# Patient Record
Sex: Female | Born: 1955 | Race: Black or African American | Hispanic: No | Smoking: Never smoker
Health system: Southern US, Community
[De-identification: ages and names within clinical notes are randomized; demographics above are authoritative.]

## PROBLEM LIST (undated history)

## (undated) ENCOUNTER — Ambulatory Visit: Admission: EM | Discharge status: Absent without leave | Payer: Self-pay

## (undated) DIAGNOSIS — G4700 Insomnia, unspecified: Secondary | ICD-10-CM

## (undated) DIAGNOSIS — R51 Headache: Secondary | ICD-10-CM

## (undated) DIAGNOSIS — K219 Gastro-esophageal reflux disease without esophagitis: Secondary | ICD-10-CM

## (undated) DIAGNOSIS — R519 Headache, unspecified: Secondary | ICD-10-CM

## (undated) DIAGNOSIS — M5030 Other cervical disc degeneration, unspecified cervical region: Secondary | ICD-10-CM

## (undated) HISTORY — PX: ABDOMINAL HYSTERECTOMY: SHX81

## (undated) HISTORY — PX: ECTOPIC PREGNANCY SURGERY: SHX613

---

## 1998-05-23 ENCOUNTER — Other Ambulatory Visit: Admission: RE | Admit: 1998-05-23 | Discharge: 1998-05-23 | Payer: Self-pay | Admitting: Obstetrics and Gynecology

## 1999-07-17 ENCOUNTER — Other Ambulatory Visit: Admission: RE | Admit: 1999-07-17 | Discharge: 1999-07-17 | Payer: Self-pay | Admitting: Obstetrics and Gynecology

## 2000-11-09 ENCOUNTER — Emergency Department (HOSPITAL_COMMUNITY): Admission: EM | Admit: 2000-11-09 | Discharge: 2000-11-09 | Payer: Self-pay | Admitting: Emergency Medicine

## 2000-11-09 ENCOUNTER — Encounter: Payer: Self-pay | Admitting: Emergency Medicine

## 2001-06-14 ENCOUNTER — Other Ambulatory Visit: Admission: RE | Admit: 2001-06-14 | Discharge: 2001-06-14 | Payer: Self-pay | Admitting: Obstetrics and Gynecology

## 2003-03-20 ENCOUNTER — Other Ambulatory Visit: Admission: RE | Admit: 2003-03-20 | Discharge: 2003-03-20 | Payer: Self-pay | Admitting: *Deleted

## 2003-08-30 ENCOUNTER — Ambulatory Visit (HOSPITAL_COMMUNITY): Admission: RE | Admit: 2003-08-30 | Discharge: 2003-08-30 | Payer: Self-pay | Admitting: *Deleted

## 2004-04-15 ENCOUNTER — Ambulatory Visit: Payer: Self-pay | Admitting: Internal Medicine

## 2004-05-26 ENCOUNTER — Ambulatory Visit (HOSPITAL_COMMUNITY): Admission: RE | Admit: 2004-05-26 | Discharge: 2004-05-26 | Payer: Self-pay | Admitting: Internal Medicine

## 2005-03-05 ENCOUNTER — Other Ambulatory Visit: Admission: RE | Admit: 2005-03-05 | Discharge: 2005-03-05 | Payer: Self-pay | Admitting: Obstetrics & Gynecology

## 2007-02-23 ENCOUNTER — Encounter: Admission: RE | Admit: 2007-02-23 | Discharge: 2007-02-23 | Payer: Self-pay | Admitting: Specialist

## 2010-05-10 ENCOUNTER — Encounter: Payer: Self-pay | Admitting: Internal Medicine

## 2011-11-12 ENCOUNTER — Ambulatory Visit (INDEPENDENT_AMBULATORY_CARE_PROVIDER_SITE_OTHER): Payer: BC Managed Care – PPO | Admitting: Emergency Medicine

## 2011-11-12 VITALS — BP 138/78 | HR 86 | Temp 97.6°F | Resp 16 | Ht 63.5 in | Wt 139.8 lb

## 2011-11-12 DIAGNOSIS — T6391XA Toxic effect of contact with unspecified venomous animal, accidental (unintentional), initial encounter: Secondary | ICD-10-CM

## 2011-11-12 DIAGNOSIS — T63481A Toxic effect of venom of other arthropod, accidental (unintentional), initial encounter: Secondary | ICD-10-CM

## 2011-11-12 MED ORDER — EPINEPHRINE 0.3 MG/0.3ML IJ DEVI: 0.3000 mg | Freq: Once | INTRAMUSCULAR | Status: DC

## 2011-11-12 MED ORDER — METHYLPREDNISOLONE ACETATE 80 MG/ML IJ SUSP
80.0000 mg | Freq: Once | INTRAMUSCULAR | Status: AC
  Administered 2011-11-12: 80 mg via INTRAMUSCULAR

## 2011-11-12 NOTE — Progress Notes (Signed)
  Subjective:    Patient ID: Kristen Chen, female    DOB: [birth year], 56 y.o.   MRN: [id number]  HPI patient presents with multiple stings on both arms and her trough from Tuesday night. She is taking Benadryl without much improvement she's also been using some cortisone cream to    Review of Systems     Objective:   Physical Exam there are multiple insets things involving the arms and back. Some of these extend up to 6 inches in size. Her chest is clear her throat is normal        Assessment & Plan:  She will continue with Benadryl ice and elevation. She is given 80 of Depo-Medrol today. She will continue to ice the areas.

## 2011-11-18 ENCOUNTER — Ambulatory Visit (INDEPENDENT_AMBULATORY_CARE_PROVIDER_SITE_OTHER): Payer: BC Managed Care – PPO | Admitting: Emergency Medicine

## 2011-11-18 VITALS — BP 141/82 | HR 66 | Temp 98.4°F | Resp 16 | Ht 63.5 in | Wt 138.0 lb

## 2011-11-18 DIAGNOSIS — T6391XA Toxic effect of contact with unspecified venomous animal, accidental (unintentional), initial encounter: Secondary | ICD-10-CM

## 2011-11-18 DIAGNOSIS — T63481A Toxic effect of venom of other arthropod, accidental (unintentional), initial encounter: Secondary | ICD-10-CM

## 2011-11-18 MED ORDER — METHYLPREDNISOLONE ACETATE 80 MG/ML IJ SUSP
80.0000 mg | Freq: Once | INTRAMUSCULAR | Status: AC
  Administered 2011-11-18: 80 mg via INTRAMUSCULAR

## 2011-11-18 NOTE — Progress Notes (Signed)
  Subjective:    Patient ID: Kristen Chen, female    DOB: April 23, 1955, 56 y.o.   MRN: [id number]  HPI  Repeat visit. Here last week with multiple hymenoptera stings.  No shortness of breath or wheezing.  Did well and now beginning to have more erythema as steroid wears off.   Denies other complaint  Review of Systems As per HPI, otherwise negative.      Objective:   Physical Exam  NAD  Has multiple erythematous pruritic lesions on arms. Chest CTA  BS=  GEN: WDWN, NAD, Non-toxic, Alert & Oriented x 3 HEENT: Atraumatic, Normocephalic.  Ears and Nose: No external deformity. EXTR: No clubbing/cyanosis/edema NEURO: Normal gait.  PSYCH: Normally interactive. Conversant. Not depressed or anxious appearing.  Calm demeanor.        Assessment & Plan:  Insect stings Depo medrol Follow up as needed Continue benadryl

## 2012-12-21 ENCOUNTER — Other Ambulatory Visit: Payer: Self-pay | Admitting: Physical Medicine and Rehabilitation

## 2012-12-21 DIAGNOSIS — M542 Cervicalgia: Secondary | ICD-10-CM

## 2012-12-31 ENCOUNTER — Ambulatory Visit
Admission: RE | Admit: 2012-12-31 | Discharge: 2012-12-31 | Disposition: A | Discharge status: Home / Self Care | Payer: BC Managed Care – PPO | Source: Ambulatory Visit | Attending: Physical Medicine and Rehabilitation | Admitting: Physical Medicine and Rehabilitation

## 2012-12-31 DIAGNOSIS — M542 Cervicalgia: Secondary | ICD-10-CM

## 2013-04-04 ENCOUNTER — Ambulatory Visit
Admission: RE | Admit: 2013-04-04 | Discharge: 2013-04-04 | Disposition: A | Discharge status: Home / Self Care | Payer: BC Managed Care – PPO | Source: Ambulatory Visit | Attending: Physical Medicine and Rehabilitation | Admitting: Physical Medicine and Rehabilitation

## 2013-04-04 ENCOUNTER — Other Ambulatory Visit: Payer: Self-pay | Admitting: Physical Medicine and Rehabilitation

## 2013-04-04 DIAGNOSIS — M25561 Pain in right knee: Secondary | ICD-10-CM

## 2013-04-05 ENCOUNTER — Other Ambulatory Visit: Payer: Self-pay | Admitting: Physical Medicine and Rehabilitation

## 2013-04-05 DIAGNOSIS — M25561 Pain in right knee: Secondary | ICD-10-CM

## 2013-04-15 ENCOUNTER — Other Ambulatory Visit: Payer: BC Managed Care – PPO

## 2014-01-23 ENCOUNTER — Emergency Department (HOSPITAL_COMMUNITY)
Admission: EM | Admit: 2014-01-23 | Discharge: 2014-01-23 | Disposition: A | Discharge status: Home / Self Care | Payer: BC Managed Care – PPO | Attending: Emergency Medicine | Admitting: Emergency Medicine

## 2014-01-23 ENCOUNTER — Encounter (HOSPITAL_COMMUNITY): Payer: Self-pay | Admitting: Emergency Medicine

## 2014-01-23 DIAGNOSIS — Z8719 Personal history of other diseases of the digestive system: Secondary | ICD-10-CM | POA: Insufficient documentation

## 2014-01-23 DIAGNOSIS — Z79899 Other long term (current) drug therapy: Secondary | ICD-10-CM | POA: Insufficient documentation

## 2014-01-23 DIAGNOSIS — G4700 Insomnia, unspecified: Secondary | ICD-10-CM | POA: Diagnosis not present

## 2014-01-23 DIAGNOSIS — R51 Headache: Secondary | ICD-10-CM | POA: Diagnosis present

## 2014-01-23 DIAGNOSIS — Z791 Long term (current) use of non-steroidal anti-inflammatories (NSAID): Secondary | ICD-10-CM | POA: Insufficient documentation

## 2014-01-23 DIAGNOSIS — G44209 Tension-type headache, unspecified, not intractable: Secondary | ICD-10-CM | POA: Insufficient documentation

## 2014-01-23 HISTORY — DX: Other cervical disc degeneration, unspecified cervical region: M50.30

## 2014-01-23 HISTORY — DX: Insomnia, unspecified: G47.00

## 2014-01-23 HISTORY — DX: Headache, unspecified: R51.9

## 2014-01-23 HISTORY — DX: Gastro-esophageal reflux disease without esophagitis: K21.9

## 2014-01-23 HISTORY — DX: Headache: R51

## 2014-01-23 MED ORDER — KETOROLAC TROMETHAMINE 60 MG/2ML IM SOLN
60.0000 mg | Freq: Once | INTRAMUSCULAR | Status: AC
  Administered 2014-01-23: 60 mg via INTRAMUSCULAR
  Filled 2014-01-23: qty 2

## 2014-01-23 NOTE — ED Provider Notes (Signed)
Medical screening examination/treatment/procedure(s) were performed by non-physician practitioner and as supervising physician I was immediately available for consultation/collaboration.    Orvan Papadakis, MLinwood Dibbles 01/23/14 [phone]

## 2014-01-23 NOTE — ED Notes (Signed)
Pt with hx of headaches.  States 3 months ago, had sharp pain in neck to head when "swallowing".  Pt states it went away.  Now she is still getting headaches off/on.  Today she comes in b/c it is associated with nausea.

## 2014-01-23 NOTE — ED Provider Notes (Signed)
CSN: [id number]     Arrival date & time 01/23/14  1247 History   First MD Initiated Contact with Patient 01/23/14 1310     Chief Complaint  Patient presents with  . Headache     (Consider location/radiation/quality/duration/timing/severity/associated sxs/prior Treatment) HPI Comments: 58 year old female with PMH of cervical degenerative disc disease complains of 3 month history of headaches, becoming more consistent over the past three weeks. Headaches have recently become associated with blurry vision while looking at her computer and nausea the past three days. Headaches start around 10:30a everyday while at work and progressively get worse during the day, mostly left-sided with some right-sided radiation. She reports work is very stressful. She denies fever, vomiting, one-sided weakness, facial droop, slurred speech or thunderclap headache. She had been on Tramadol long-term for degenerative disc and switched it to Indomethacin one week ago to see if that relieved the headache, but has not made a difference. She has not tried any other OTC medications to relieve the pain.   Patient is a 58 y.o. female presenting with headaches. The history is provided by the patient.  Headache Associated symptoms: nausea     Past Medical History  Diagnosis Date  . Headache   . Degenerative disc disease, cervical   . Insomnia   . GERD (gastroesophageal reflux disease)    Past Surgical History  Procedure Laterality Date  . Abdominal hysterectomy    . Ectopic pregnancy surgery     History reviewed. No pertinent family history. History  Substance Use Topics  . Smoking status: Never Smoker   . Smokeless tobacco: Not on file  . Alcohol Use: Yes     Comment: occasional   OB History   Grav Para Term Preterm Abortions TAB SAB Ect Mult Living                 Review of Systems  Eyes:       +blurred vision when looking at computer screen  Gastrointestinal: Positive for nausea.  Neurological:  Positive for headaches.  All other systems reviewed and are negative.     Allergies  Codeine  Home Medications   Prior to Admission medications   Medication Sig Start Date End Date Taking? Authorizing Provider  EPINEPHrine (EPIPEN 2-PAK) 0.3 mg/0.3 mL IJ SOAJ injection Inject 0.3 mg into the muscle once.   Yes Historical Provider, MD  Indomethacin (TIVORBEX) 40 MG CAPS Take 40 mg by mouth 3 (three) times daily.   Yes Historical Provider, MD  traMADol (ULTRAM) 50 MG tablet Take 50 mg by mouth 2 (two) times daily.   Yes Historical Provider, MD  zolpidem (AMBIEN) 10 MG tablet Take 5-10 mg by mouth at bedtime as needed for sleep.   Yes Historical Provider, MD   BP 140/83  Pulse 77  Temp(Src) 98.6 F (37 C) (Oral)  Resp 16  SpO2 100% Physical Exam  Nursing note and vitals reviewed. Constitutional: She is oriented to person, place, and time. She appears well-developed and well-nourished. No distress.  HENT:  Head: Normocephalic and atraumatic.  Mouth/Throat: Oropharynx is clear and moist.  Eyes: Conjunctivae and EOM are normal. Pupils are equal, round, and reactive to light.  Neck: Normal range of motion. Neck supple.  No meningeal signs.  Cardiovascular: Normal rate, regular rhythm, normal heart sounds and intact distal pulses.   Pulmonary/Chest: Effort normal and breath sounds normal. No respiratory distress.  Abdominal: Soft. Bowel sounds are normal. There is no tenderness.  Musculoskeletal: Normal range of motion.  She exhibits no edema.  Neurological: She is alert and oriented to person, place, and time. She has normal strength. No cranial nerve deficit or sensory deficit. Coordination and gait normal.  Speech fluent, goal oriented. Moves limbs without ataxia. Equal grip strength bilaterally. Normal gait.  Skin: Skin is warm and dry. No rash noted. She is not diaphoretic.  Psychiatric: She has a normal mood and affect. Her behavior is normal.    ED Course  Procedures  (including critical care time) Labs Review Labs Reviewed - No data to display  Imaging Review No results found.   EKG Interpretation None      MDM   Final diagnoses:  Tension headache   Pt well-appearing in no apparent distress. Afebrile, vitals stable. No red flags concerning patient's headache. No meningeal signs, afebrile, no focal neurologic deficits, no thunderclap appearance. Doubt SAH, ICH, meningitis. Headache improved after receiving IM Toradol and resting on the bed. Pt requesting a day off work as she thinks it may help her headaches. Discussed tension headaches with pt, advised NSAIDs, rest, f/u with PCP. Return precautions given. Patient states understanding of treatment care plan and is agreeable.   Kathrynn Speed, PA-C 01/23/14 1513

## 2014-01-23 NOTE — Discharge Instructions (Signed)

## 2014-01-25 ENCOUNTER — Other Ambulatory Visit: Payer: Self-pay | Admitting: Physical Medicine and Rehabilitation

## 2014-01-25 DIAGNOSIS — R51 Headache: Principal | ICD-10-CM

## 2014-01-25 DIAGNOSIS — R519 Headache, unspecified: Secondary | ICD-10-CM

## 2014-01-30 ENCOUNTER — Ambulatory Visit
Admission: RE | Admit: 2014-01-30 | Discharge: 2014-01-30 | Disposition: A | Discharge status: Home / Self Care | Payer: BC Managed Care – PPO | Source: Ambulatory Visit | Attending: Physical Medicine and Rehabilitation | Admitting: Physical Medicine and Rehabilitation

## 2014-01-30 DIAGNOSIS — R519 Headache, unspecified: Secondary | ICD-10-CM

## 2014-01-30 DIAGNOSIS — R51 Headache: Principal | ICD-10-CM

## 2014-10-25 ENCOUNTER — Ambulatory Visit (INDEPENDENT_AMBULATORY_CARE_PROVIDER_SITE_OTHER): Payer: BLUE CROSS/BLUE SHIELD | Admitting: Physician Assistant

## 2014-10-25 ENCOUNTER — Other Ambulatory Visit: Payer: Self-pay | Admitting: Physician Assistant

## 2014-10-25 ENCOUNTER — Ambulatory Visit (INDEPENDENT_AMBULATORY_CARE_PROVIDER_SITE_OTHER): Payer: BLUE CROSS/BLUE SHIELD

## 2014-10-25 VITALS — BP 164/88 | HR 81 | Temp 98.7°F | Resp 16 | Ht 63.0 in | Wt 149.0 lb

## 2014-10-25 DIAGNOSIS — M542 Cervicalgia: Secondary | ICD-10-CM

## 2014-10-25 DIAGNOSIS — R030 Elevated blood-pressure reading, without diagnosis of hypertension: Secondary | ICD-10-CM | POA: Diagnosis not present

## 2014-10-25 DIAGNOSIS — IMO0001 Reserved for inherently not codable concepts without codable children: Secondary | ICD-10-CM

## 2014-10-25 MED ORDER — NAPROXEN 500 MG PO TABS: 500.0000 mg | ORAL_TABLET | Freq: Two times a day (BID) | ORAL | Status: DC

## 2014-10-25 MED ORDER — CYCLOBENZAPRINE HCL 10 MG PO TABS: 10.0000 mg | ORAL_TABLET | Freq: Three times a day (TID) | ORAL | Status: AC | PRN

## 2014-10-25 MED ORDER — TRAMADOL-ACETAMINOPHEN 37.5-325 MG PO TABS: 1.0000 | ORAL_TABLET | Freq: Four times a day (QID) | ORAL | Status: DC | PRN
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
## 2014-10-26 LAB — CBC WITH DIFFERENTIAL/PLATELET
Basophils Absolute: 0 10*3/uL (ref 0.0–0.1)
Basophils Relative: 0 % (ref 0–1)
EOS PCT: 1 % (ref 0–5)
Eosinophils Absolute: 0.1 10*3/uL (ref 0.0–0.7)
HCT: 36.6 % (ref 36.0–46.0)
HEMOGLOBIN: 12.4 g/dL (ref 12.0–15.0)
LYMPHS ABS: 3.4 10*3/uL (ref 0.7–4.0)
LYMPHS PCT: 46 % (ref 12–46)
MCH: 28.6 pg (ref 26.0–34.0)
MCHC: 33.9 g/dL (ref 30.0–36.0)
MCV: 84.5 fL (ref 78.0–100.0)
MPV: 10.1 fL (ref 8.6–12.4)
Monocytes Absolute: 0.4 10*3/uL (ref 0.1–1.0)
Monocytes Relative: 6 % (ref 3–12)
NEUTROS PCT: 47 % (ref 43–77)
Neutro Abs: 3.4 10*3/uL (ref 1.7–7.7)
PLATELETS: 259 10*3/uL (ref 150–400)
RBC: 4.33 MIL/uL (ref 3.87–5.11)
RDW: 12.8 % (ref 11.5–15.5)
WBC: 7.3 10*3/uL (ref 4.0–10.5)

## 2014-10-26 LAB — COMPLETE METABOLIC PANEL WITH GFR
ALT: 11 U/L (ref 0–35)
AST: 17 U/L (ref 0–37)
Albumin: 4.6 g/dL (ref 3.5–5.2)
Alkaline Phosphatase: 81 U/L (ref 39–117)
BILIRUBIN TOTAL: 0.5 mg/dL (ref 0.2–1.2)
BUN: 10 mg/dL (ref 6–23)
CHLORIDE: 103 meq/L (ref 96–112)
CO2: 22 mEq/L (ref 19–32)
Calcium: 9.6 mg/dL (ref 8.4–10.5)
Creat: 0.8 mg/dL (ref 0.50–1.10)
GFR, Est Non African American: 81 mL/min
Glucose, Bld: 93 mg/dL (ref 70–99)
Potassium: 3.4 mEq/L — ABNORMAL LOW (ref 3.5–5.3)
SODIUM: 140 meq/L (ref 135–145)
Total Protein: 7.6 g/dL (ref 6.0–8.3)

## 2014-10-26 LAB — TSH: TSH: 1.678 u[IU]/mL (ref 0.350–4.500)

## 2014-10-27 LAB — MAGNESIUM: MAGNESIUM: 1.8 mg/dL (ref 1.5–2.5)

## 2014-10-29 ENCOUNTER — Ambulatory Visit (INDEPENDENT_AMBULATORY_CARE_PROVIDER_SITE_OTHER): Payer: BLUE CROSS/BLUE SHIELD

## 2014-10-29 ENCOUNTER — Ambulatory Visit (INDEPENDENT_AMBULATORY_CARE_PROVIDER_SITE_OTHER): Payer: BLUE CROSS/BLUE SHIELD | Admitting: Physician Assistant

## 2014-10-29 VITALS — BP 156/94 | HR 77 | Temp 98.2°F | Resp 17 | Ht 63.0 in | Wt 149.6 lb

## 2014-10-29 DIAGNOSIS — R29898 Other symptoms and signs involving the musculoskeletal system: Secondary | ICD-10-CM

## 2014-10-29 DIAGNOSIS — M25512 Pain in left shoulder: Secondary | ICD-10-CM

## 2014-10-29 DIAGNOSIS — M19019 Primary osteoarthritis, unspecified shoulder: Secondary | ICD-10-CM

## 2014-10-29 DIAGNOSIS — M129 Arthropathy, unspecified: Secondary | ICD-10-CM

## 2014-10-29 MED ORDER — GABAPENTIN 100 MG PO CAPS: 100.0000 mg | ORAL_CAPSULE | Freq: Three times a day (TID) | ORAL | Status: AC
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
## 2014-11-05 ENCOUNTER — Other Ambulatory Visit: Payer: Self-pay | Admitting: Physician Assistant

## 2014-11-05 DIAGNOSIS — E876 Hypokalemia: Secondary | ICD-10-CM

## 2014-11-09 ENCOUNTER — Telehealth: Payer: Self-pay

## 2014-11-09 ENCOUNTER — Other Ambulatory Visit: Payer: Self-pay | Admitting: Physician Assistant

## 2014-11-09 DIAGNOSIS — M542 Cervicalgia: Secondary | ICD-10-CM

## 2014-11-09 NOTE — Telephone Encounter (Signed)
Pt was returning a call that she received at 1:43p today from Gerrianne Scale  "progress notes"  . She said that she is out of town and will return Monday.

## 2014-11-09 NOTE — Progress Notes (Signed)
Unable to leave a message Please ask her to rtc for a redraw of CMP

## 2014-11-12 ENCOUNTER — Other Ambulatory Visit (INDEPENDENT_AMBULATORY_CARE_PROVIDER_SITE_OTHER): Payer: BLUE CROSS/BLUE SHIELD | Admitting: Family Medicine

## 2014-11-12 DIAGNOSIS — E876 Hypokalemia: Secondary | ICD-10-CM | POA: Diagnosis not present

## 2014-11-12 LAB — COMPLETE METABOLIC PANEL WITH GFR
ALT: 11 U/L (ref 6–29)
AST: 16 U/L (ref 10–35)
Albumin: 4.5 g/dL (ref 3.6–5.1)
Alkaline Phosphatase: 76 U/L (ref 33–130)
BILIRUBIN TOTAL: 0.3 mg/dL (ref 0.2–1.2)
BUN: 15 mg/dL (ref 7–25)
CHLORIDE: 104 mmol/L (ref 98–110)
CO2: 25 mmol/L (ref 20–31)
Calcium: 9.6 mg/dL (ref 8.6–10.4)
Creat: 0.69 mg/dL (ref 0.50–1.05)
GFR, Est Non African American: >89 mL/min (ref >=60)
Glucose, Bld: 86 mg/dL (ref 65–99)
Potassium: 4.1 mmol/L (ref 3.5–5.3)
Sodium: 141 mmol/L (ref 135–146)
Total Protein: 7.3 g/dL (ref 6.1–8.1)

## 2014-11-12 NOTE — Telephone Encounter (Signed)
Pt stopped in for lab work and wanted to let Deliah Boston know that the two medications are working for her and she would like the refills. Thank you

## 2014-11-12 NOTE — Telephone Encounter (Signed)
Left VM for pt to call back and clarify which 2 medications she wanted refills on. Casimiro Needle had prescribed her three different medications.

## 2014-11-12 NOTE — Telephone Encounter (Signed)
Pt CB and LM on my VM stating that the two meds are tramadol - tylenol, and naproxen. I have pended these for review. Casimiro Needle, do you want to RF these, and please review quantities on RFs also.

## 2014-11-12 NOTE — Progress Notes (Signed)
Patient came in for labs only.

## 2014-11-14 NOTE — Telephone Encounter (Signed)
Okay to refill.  You may want to call the pharmacy for the Tramadol refill. Deliah Boston, MS, PA-C   1:43 PM, 11/14/2014

## 2014-11-14 NOTE — Telephone Encounter (Signed)
Patient called to follow up on refill request °

## 2014-11-15 MED ORDER — NAPROXEN 500 MG PO TABS: 500.0000 mg | ORAL_TABLET | Freq: Two times a day (BID) | ORAL | Status: AC

## 2014-11-15 MED ORDER — TRAMADOL-ACETAMINOPHEN 37.5-325 MG PO TABS: 1.0000 | ORAL_TABLET | Freq: Four times a day (QID) | ORAL | Status: AC | PRN

## 2014-11-15 NOTE — Telephone Encounter (Signed)
Sent in naproxen and called in tramadol RFs. Notified pt.

## 2014-12-01 ENCOUNTER — Telehealth: Payer: Self-pay

## 2014-12-01 NOTE — Telephone Encounter (Signed)
Patient had labs done in July and states she has not been contacted with the results. Please call patient at [phone]

## 2014-12-04 ENCOUNTER — Telehealth: Payer: Self-pay

## 2014-12-04 NOTE — Telephone Encounter (Signed)
My last conversation with patient was on 12/01/14-RE:Spoke to patient and she stated she has been busy and was unable to call back. She told me her orthopedic is Dr Eduard Clos. Notes faxed to [phone] [facility]   [address], [address] [place], [place] [address] [phone]  Do I need to call patient or Dr Ozzie Hoyle office?

## 2014-12-04 NOTE — Telephone Encounter (Signed)
Crystal, Dr. Eduard Clos office called stating they received a referral from our office with your name on it. Pt is already an established pt there. Did we mean to send the referral to an orthopedist? This is the latest referral in Epic. Let me know.  Sue Lush [phone].

## 2014-12-04 NOTE — Telephone Encounter (Signed)
FYI-I called patient her a message to try to get clarification, letting her know we received a call from Dr Ozzie Hoyle office that she was already a patient. I asked her to please call Dr Eduard Clos office and schedule an appointment if that is what she needed. If not to call referrals back to let us know.

## 2014-12-04 NOTE — Telephone Encounter (Signed)
Tishira did you review labs?

## 2014-12-04 NOTE — Telephone Encounter (Signed)
Relayed repeat CMP results which was normal.

## 2014-12-14 ENCOUNTER — Telehealth: Payer: Self-pay

## 2014-12-14 DIAGNOSIS — M5030 Other cervical disc degeneration, unspecified cervical region: Secondary | ICD-10-CM

## 2014-12-14 NOTE — Telephone Encounter (Signed)
Patient called to request a referral to a neurologist.  She is an established patient at Dr. Ozzie Hoyle office, and she would like the patient to see a neurologist.  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  The patient requested Dr. Newell Coral at [facility].

## 2014-12-15 NOTE — Telephone Encounter (Signed)
Referral placed.

## 2014-12-15 NOTE — Telephone Encounter (Signed)
Can we place referral ? 

## 2015-01-10 ENCOUNTER — Telehealth: Payer: Self-pay

## 2015-01-10 NOTE — Telephone Encounter (Signed)
Patient left voicemail on Wednesday at 3:02pm asking for a call back. No details given. Cb# [phone] or [phone].
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
## 2015-01-16 ENCOUNTER — Telehealth: Payer: Self-pay

## 2015-01-16 NOTE — Telephone Encounter (Signed)
Patient came by the office today checking status of records request. Records were already mailed per my documentation on 01/11/15. Printed records out for patient to take with her today as well.

## 2015-01-16 NOTE — Telephone Encounter (Signed)
Patient called into MR  voicemail on Wednesday 01/16/15 at 851am all she stated in her message was her name DOB and Call back number, We need to call her back and see what records she needs and where she wants them sent to.

## 2015-09-14 IMAGING — CR DG SHOULDER 2+V*L*
3 series · 3 of 3 positions shown · non-contrast
Comparison: None.

CLINICAL DATA: 59-year-old female with left shoulder pain

EXAM:
LEFT SHOULDER - 2+ VIEW

[AP (1 of 2)]
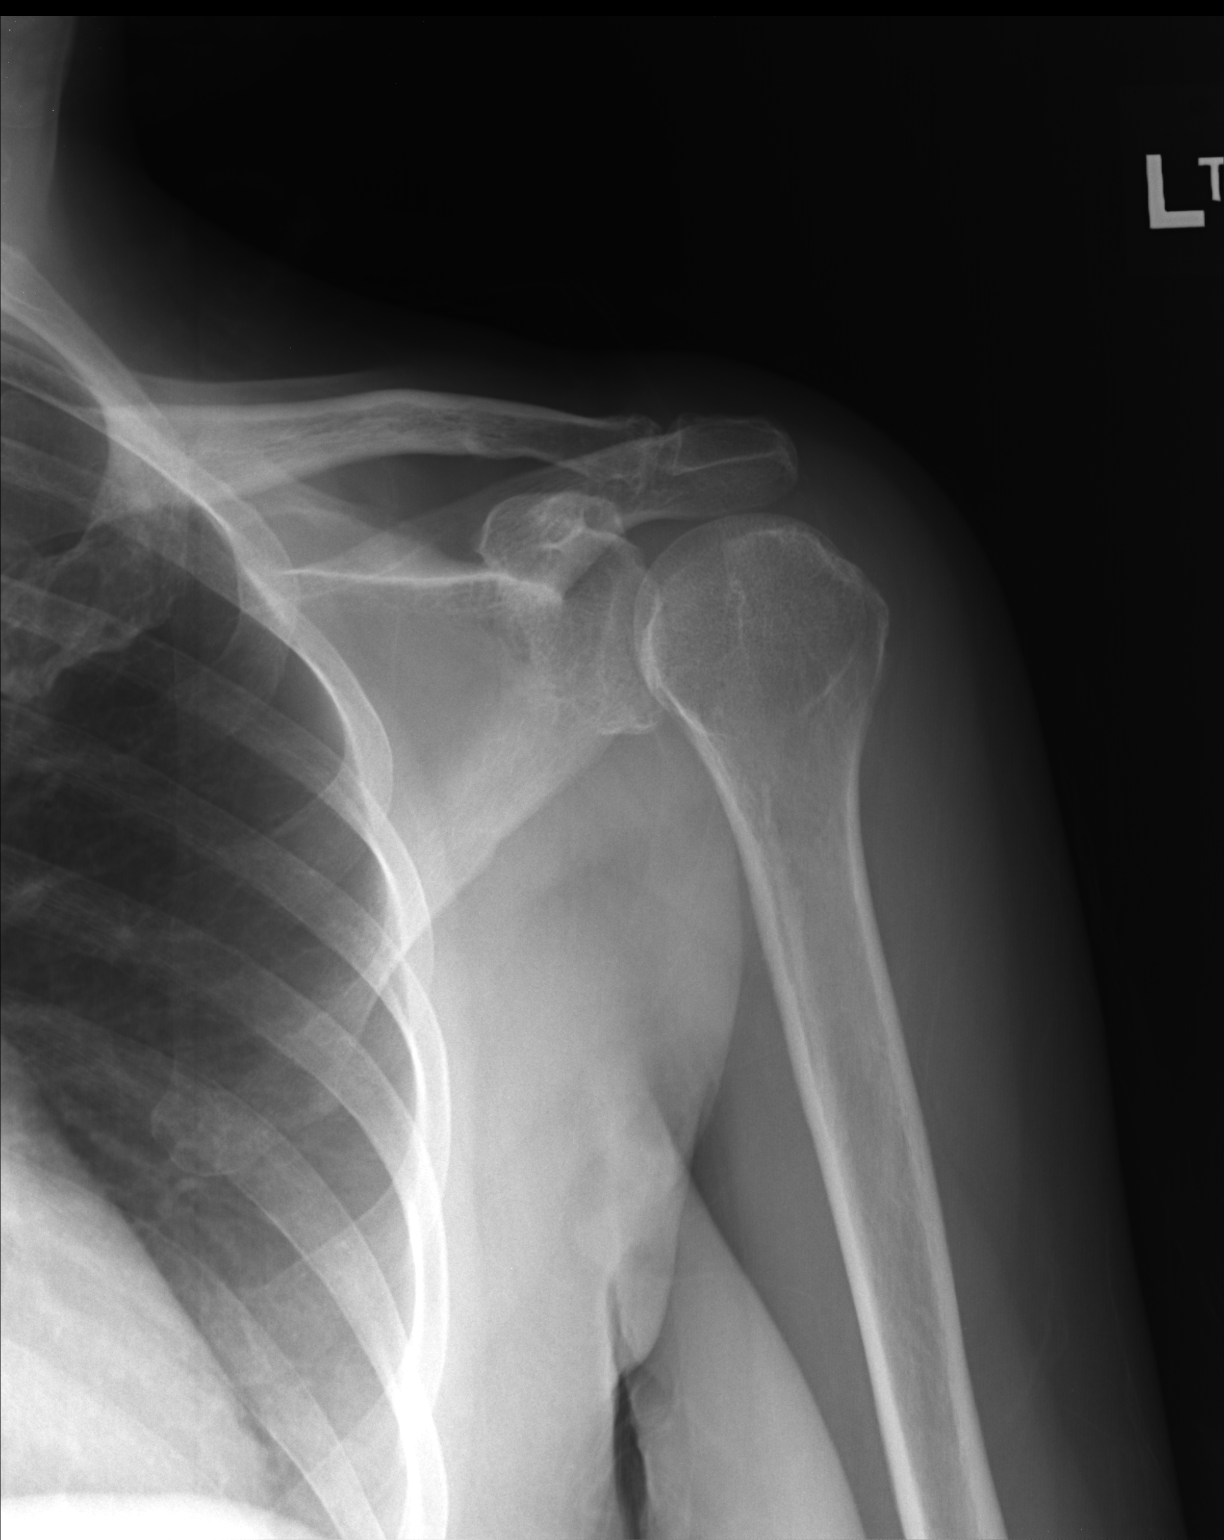

[AP (2 of 2)]
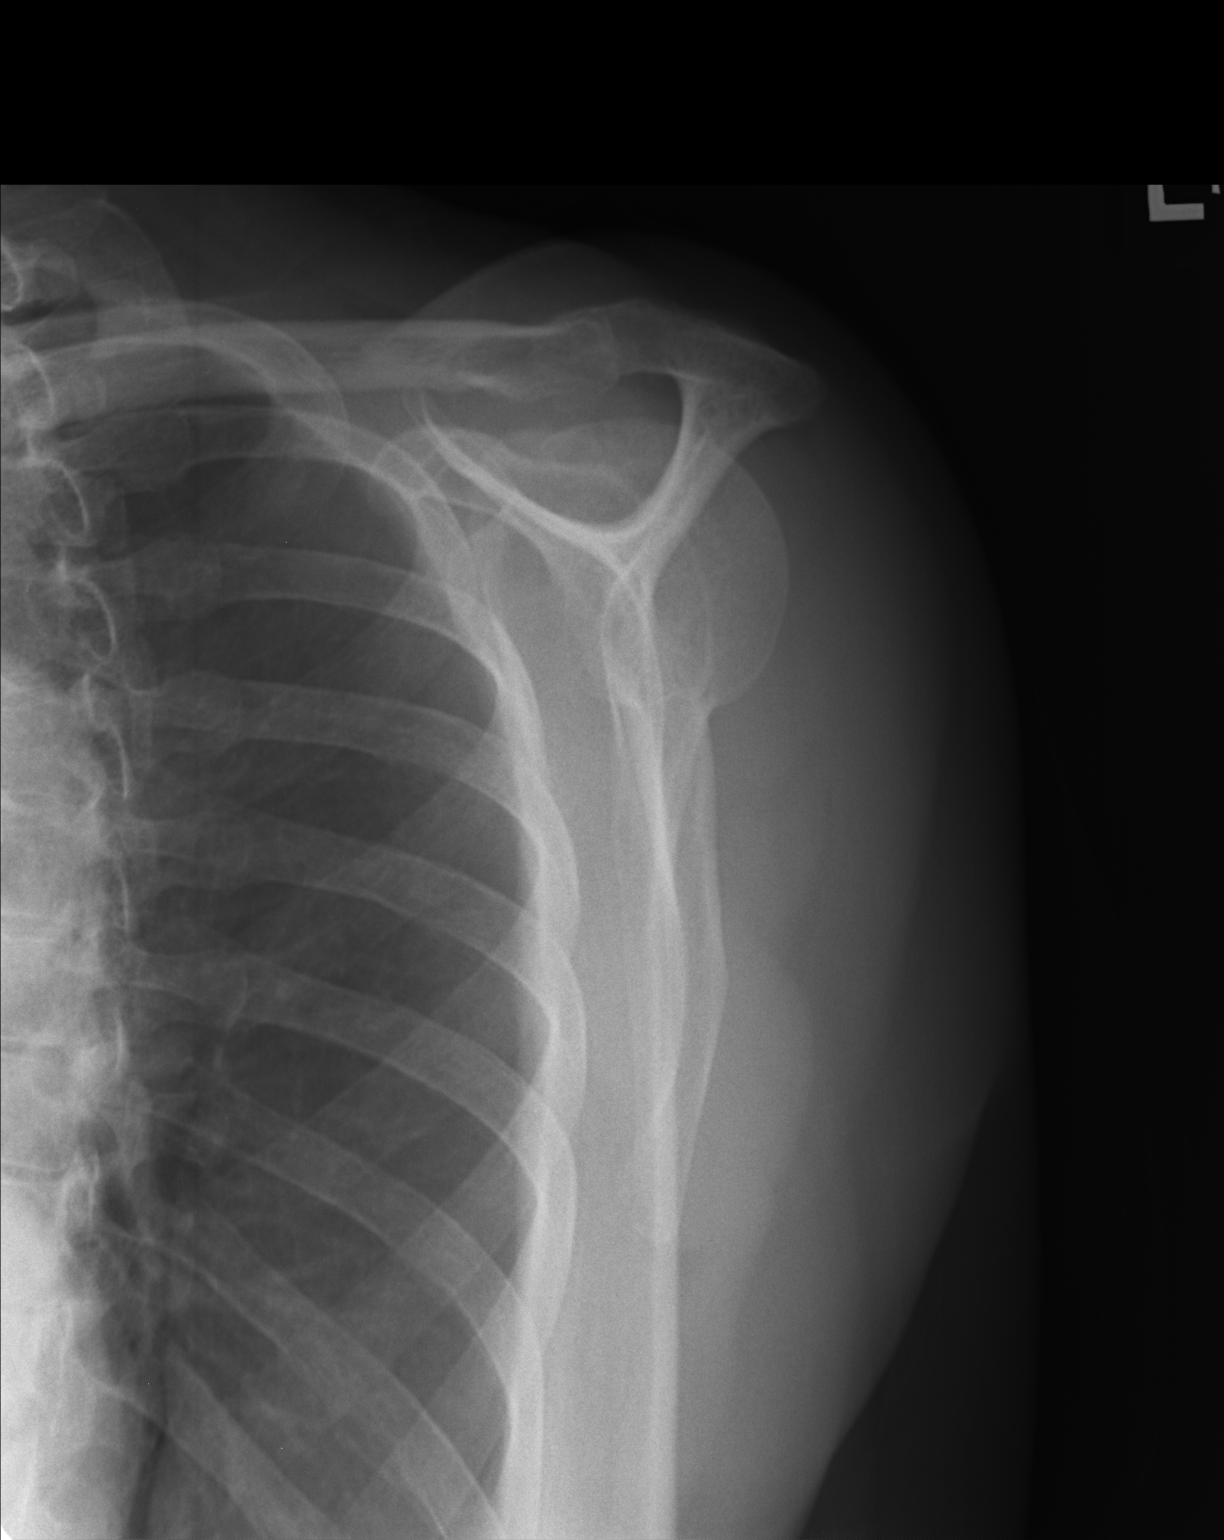

[axial]
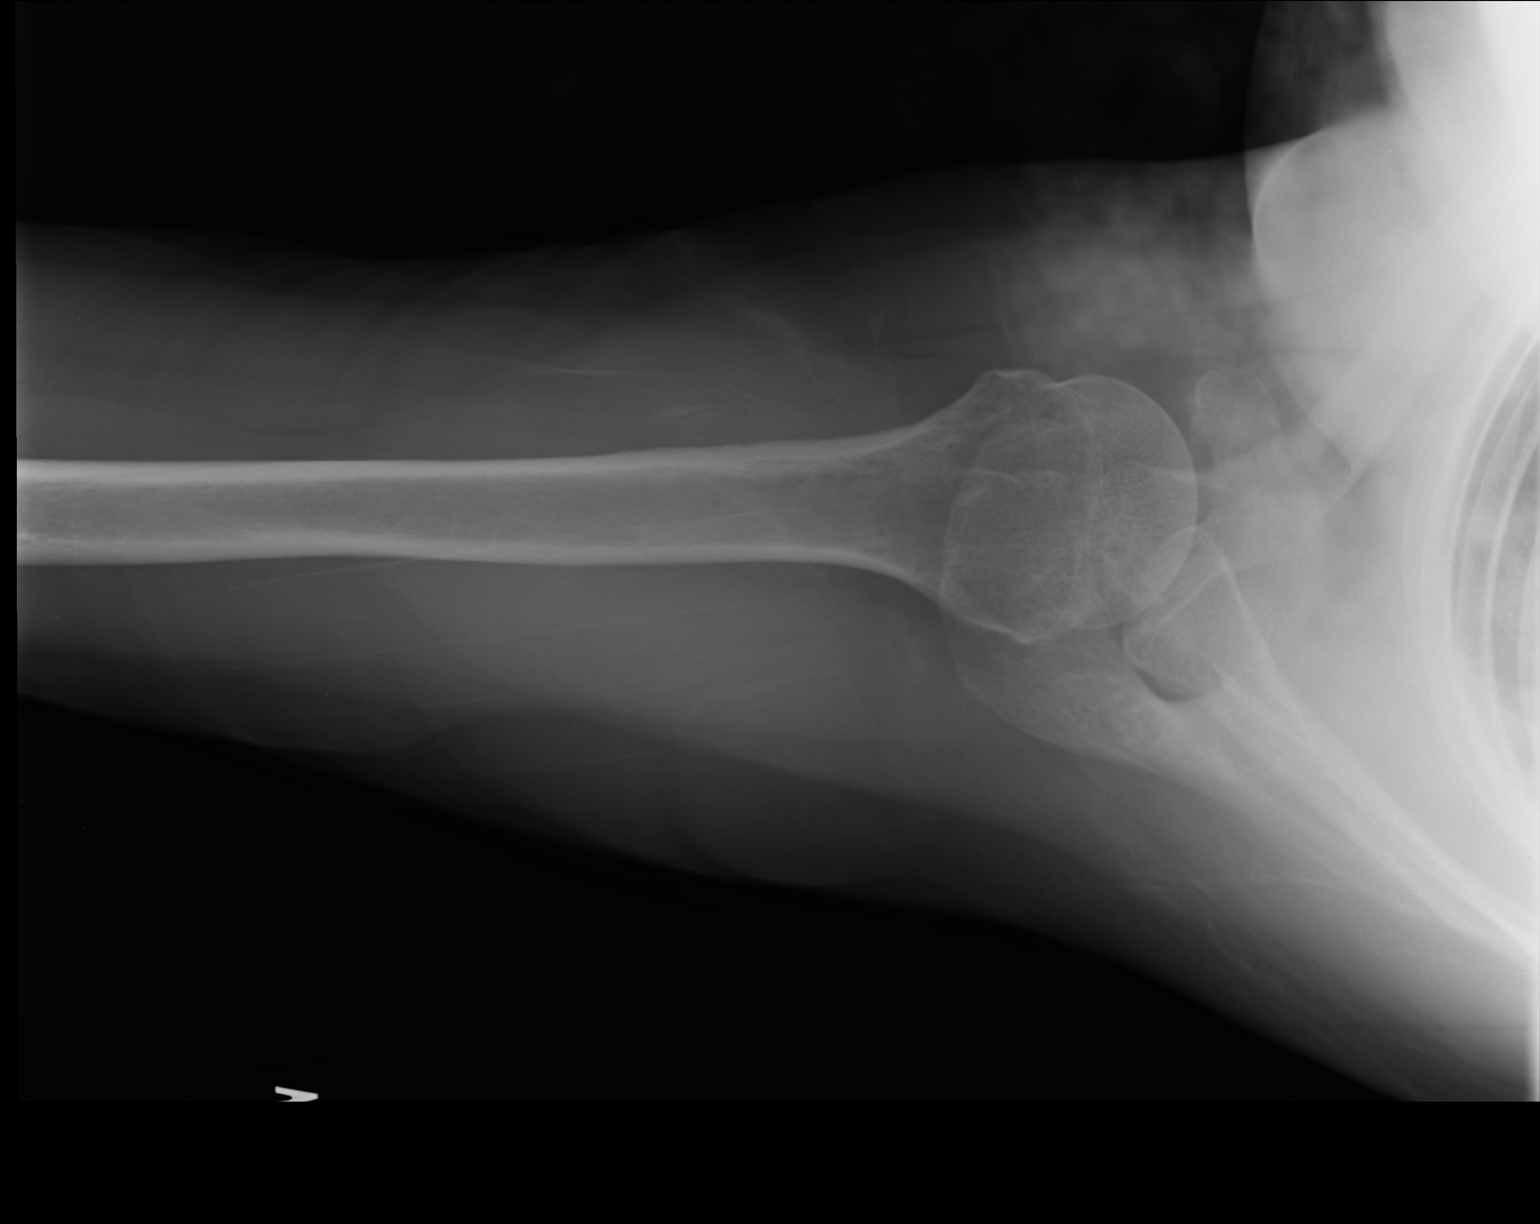

[3 of 3 positions shown; findings below may reference images not displayed]

FINDINGS: No acute fracture or dislocation. There are degenerative changes of
the and left glenohumeral and AC joints. The soft tissues are
unremarkable.
IMPRESSION: No fracture or dislocation.

## 2020-09-06 DIAGNOSIS — Z0100 Encounter for examination of eyes and vision without abnormal findings: Secondary | ICD-10-CM | POA: Diagnosis not present

## 2020-09-06 DIAGNOSIS — H524 Presbyopia: Secondary | ICD-10-CM | POA: Diagnosis not present

## 2020-09-27 DIAGNOSIS — H179 Unspecified corneal scar and opacity: Secondary | ICD-10-CM | POA: Diagnosis not present

## 2020-10-26 DIAGNOSIS — I10 Essential (primary) hypertension: Secondary | ICD-10-CM | POA: Diagnosis not present

## 2020-12-05 DIAGNOSIS — Z0000 Encounter for general adult medical examination without abnormal findings: Secondary | ICD-10-CM | POA: Diagnosis not present

## 2020-12-05 DIAGNOSIS — E559 Vitamin D deficiency, unspecified: Secondary | ICD-10-CM | POA: Diagnosis not present

## 2020-12-05 DIAGNOSIS — Z1159 Encounter for screening for other viral diseases: Secondary | ICD-10-CM | POA: Diagnosis not present

## 2020-12-05 DIAGNOSIS — R0602 Shortness of breath: Secondary | ICD-10-CM | POA: Diagnosis not present

## 2020-12-05 DIAGNOSIS — R5383 Other fatigue: Secondary | ICD-10-CM | POA: Diagnosis not present

## 2020-12-05 DIAGNOSIS — Z131 Encounter for screening for diabetes mellitus: Secondary | ICD-10-CM | POA: Diagnosis not present

## 2020-12-05 DIAGNOSIS — M129 Arthropathy, unspecified: Secondary | ICD-10-CM | POA: Diagnosis not present

## 2020-12-05 DIAGNOSIS — I10 Essential (primary) hypertension: Secondary | ICD-10-CM | POA: Diagnosis not present

## 2020-12-05 DIAGNOSIS — Z79899 Other long term (current) drug therapy: Secondary | ICD-10-CM | POA: Diagnosis not present

## 2020-12-12 DIAGNOSIS — M778 Other enthesopathies, not elsewhere classified: Secondary | ICD-10-CM | POA: Diagnosis not present

## 2020-12-12 DIAGNOSIS — M720 Palmar fascial fibromatosis [Dupuytren]: Secondary | ICD-10-CM | POA: Diagnosis not present

## 2020-12-26 DIAGNOSIS — E1169 Type 2 diabetes mellitus with other specified complication: Secondary | ICD-10-CM | POA: Diagnosis not present

## 2020-12-26 DIAGNOSIS — E785 Hyperlipidemia, unspecified: Secondary | ICD-10-CM | POA: Diagnosis not present

## 2020-12-26 DIAGNOSIS — G8929 Other chronic pain: Secondary | ICD-10-CM | POA: Diagnosis not present

## 2020-12-26 DIAGNOSIS — M545 Low back pain, unspecified: Secondary | ICD-10-CM | POA: Diagnosis not present

## 2020-12-26 DIAGNOSIS — E7800 Pure hypercholesterolemia, unspecified: Secondary | ICD-10-CM | POA: Diagnosis not present

## 2020-12-26 DIAGNOSIS — Z79899 Other long term (current) drug therapy: Secondary | ICD-10-CM | POA: Diagnosis not present

## 2020-12-26 DIAGNOSIS — E559 Vitamin D deficiency, unspecified: Secondary | ICD-10-CM | POA: Diagnosis not present

## 2020-12-26 DIAGNOSIS — I10 Essential (primary) hypertension: Secondary | ICD-10-CM | POA: Diagnosis not present

## 2020-12-28 DIAGNOSIS — Z1211 Encounter for screening for malignant neoplasm of colon: Secondary | ICD-10-CM | POA: Diagnosis not present

## 2020-12-30 DIAGNOSIS — Z79899 Other long term (current) drug therapy: Secondary | ICD-10-CM | POA: Diagnosis not present

## 2021-01-09 DIAGNOSIS — R0789 Other chest pain: Secondary | ICD-10-CM | POA: Diagnosis not present

## 2021-01-09 DIAGNOSIS — M7989 Other specified soft tissue disorders: Secondary | ICD-10-CM | POA: Diagnosis not present

## 2021-01-09 DIAGNOSIS — I208 Other forms of angina pectoris: Secondary | ICD-10-CM | POA: Diagnosis not present

## 2021-01-17 DIAGNOSIS — Z1211 Encounter for screening for malignant neoplasm of colon: Secondary | ICD-10-CM | POA: Diagnosis not present

## 2021-01-17 DIAGNOSIS — E119 Type 2 diabetes mellitus without complications: Secondary | ICD-10-CM | POA: Diagnosis not present

## 2021-01-24 DIAGNOSIS — G8929 Other chronic pain: Secondary | ICD-10-CM | POA: Diagnosis not present

## 2021-01-24 DIAGNOSIS — M545 Low back pain, unspecified: Secondary | ICD-10-CM | POA: Diagnosis not present

## 2021-01-24 DIAGNOSIS — Z79899 Other long term (current) drug therapy: Secondary | ICD-10-CM | POA: Diagnosis not present

## 2021-01-24 DIAGNOSIS — G4701 Insomnia due to medical condition: Secondary | ICD-10-CM | POA: Diagnosis not present

## 2021-01-24 DIAGNOSIS — M5030 Other cervical disc degeneration, unspecified cervical region: Secondary | ICD-10-CM | POA: Diagnosis not present

## 2021-01-27 DIAGNOSIS — Z79899 Other long term (current) drug therapy: Secondary | ICD-10-CM | POA: Diagnosis not present

## 2021-01-29 DIAGNOSIS — M720 Palmar fascial fibromatosis [Dupuytren]: Secondary | ICD-10-CM | POA: Diagnosis not present

## 2021-11-24 ENCOUNTER — Ambulatory Visit
Admission: EM | Admit: 2021-11-24 | Discharge: 2021-11-24 | Disposition: A | Discharge status: Home / Self Care | Payer: Self-pay | Attending: Internal Medicine | Admitting: Internal Medicine

## 2021-11-24 ENCOUNTER — Ambulatory Visit (INDEPENDENT_AMBULATORY_CARE_PROVIDER_SITE_OTHER): Payer: Self-pay

## 2021-11-24 ENCOUNTER — Encounter: Payer: Self-pay | Admitting: Emergency Medicine

## 2021-11-24 DIAGNOSIS — R0789 Other chest pain: Secondary | ICD-10-CM

## 2021-11-24 DIAGNOSIS — M79644 Pain in right finger(s): Secondary | ICD-10-CM
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
## 2022-12-21 ENCOUNTER — Ambulatory Visit: Payer: Medicare HMO

## 2022-12-21 ENCOUNTER — Ambulatory Visit
Admission: EM | Admit: 2022-12-21 | Discharge: 2022-12-21 | Disposition: A | Discharge status: Home / Self Care | Payer: Medicare HMO

## 2022-12-21 DIAGNOSIS — M25561 Pain in right knee: Secondary | ICD-10-CM | POA: Diagnosis not present

## 2022-12-21 MED ORDER — KETOROLAC TROMETHAMINE 30 MG/ML IJ SOLN
30.0000 mg | Freq: Once | INTRAMUSCULAR | Status: AC
  Administered 2022-12-21: 30 mg via INTRAMUSCULAR

## 2022-12-21 NOTE — Discharge Instructions (Signed)
Follow up with orthopedist for further evaluation and management.  Apply ice.  Toradol shot administered today.  Do not take any ibuprofen, advil, aleve for at least 24 hours following injection.  Do not bear weight until otherwise advised.  Please use crutches.
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
## 2022-12-21 NOTE — ED Triage Notes (Signed)
Pt states she fell and her right knee went behind her. Pt in WC with ice in her knee. States she took a hydrocodone  this morning for the pain.
